# Patient Record
Sex: Female | Born: 1990 | Hispanic: Yes | Marital: Married | Smoking: Never smoker
Health system: Southern US, Community
[De-identification: ages and names within clinical notes are randomized; demographics above are authoritative.]

## PROBLEM LIST (undated history)

## (undated) DIAGNOSIS — R51 Headache: Secondary | ICD-10-CM

## (undated) DIAGNOSIS — R519 Headache, unspecified: Secondary | ICD-10-CM

---

## 2014-08-15 ENCOUNTER — Other Ambulatory Visit: Payer: Self-pay | Admitting: Advanced Practice Midwife

## 2014-08-15 DIAGNOSIS — Z3491 Encounter for supervision of normal pregnancy, unspecified, first trimester: Secondary | ICD-10-CM

## 2014-08-17 ENCOUNTER — Ambulatory Visit
Admission: RE | Admit: 2014-08-17 | Discharge: 2014-08-17 | Disposition: A | Discharge status: Home / Self Care | Payer: Medicaid Other | Source: Ambulatory Visit | Attending: Advanced Practice Midwife | Admitting: Advanced Practice Midwife

## 2014-08-17 DIAGNOSIS — Z3481 Encounter for supervision of other normal pregnancy, first trimester: Secondary | ICD-10-CM | POA: Insufficient documentation

## 2014-08-17 DIAGNOSIS — Z3491 Encounter for supervision of normal pregnancy, unspecified, first trimester: Secondary | ICD-10-CM

## 2014-11-16 ENCOUNTER — Other Ambulatory Visit: Payer: Self-pay | Admitting: Advanced Practice Midwife

## 2014-11-16 DIAGNOSIS — Z3689 Encounter for other specified antenatal screening: Secondary | ICD-10-CM

## 2014-11-23 ENCOUNTER — Ambulatory Visit
Admission: RE | Admit: 2014-11-23 | Discharge: 2014-11-23 | Disposition: A | Discharge status: Home / Self Care | Payer: Self-pay | Source: Ambulatory Visit | Attending: Advanced Practice Midwife | Admitting: Advanced Practice Midwife

## 2014-11-23 DIAGNOSIS — Z3689 Encounter for other specified antenatal screening: Secondary | ICD-10-CM

## 2014-11-23 DIAGNOSIS — Z36 Encounter for antenatal screening of mother: Secondary | ICD-10-CM | POA: Insufficient documentation

## 2015-02-09 ENCOUNTER — Encounter
Admission: RE | Admit: 2015-02-09 | Discharge: 2015-02-09 | Disposition: A | Discharge status: Home / Self Care | Payer: Medicaid Other | Source: Ambulatory Visit | Attending: Obstetrics and Gynecology | Admitting: Obstetrics and Gynecology

## 2015-02-09 DIAGNOSIS — Z01810 Encounter for preprocedural cardiovascular examination: Secondary | ICD-10-CM | POA: Insufficient documentation

## 2015-02-09 HISTORY — DX: Headache, unspecified: R51.9

## 2015-02-09 HISTORY — DX: Headache: R51

## 2015-02-09 LAB — CBC
HEMATOCRIT: 37.4 % (ref 35.0–47.0)
HEMOGLOBIN: 12.7 g/dL (ref 12.0–16.0)
MCH: 30.8 pg (ref 26.0–34.0)
MCHC: 34.1 g/dL (ref 32.0–36.0)
MCV: 90.4 fL (ref 80.0–100.0)
Platelets: 226 10*3/uL (ref 150–440)
RBC: 4.13 MIL/uL (ref 3.80–5.20)
RDW: 13.9 % (ref 11.5–14.5)
WBC: 10.3 10*3/uL (ref 3.6–11.0)

## 2015-02-09 LAB — RAPID HIV SCREEN (HIV 1/2 AB+AG)
HIV 1/2 ANTIBODIES: NONREACTIVE
HIV-1 P24 Antigen - HIV24: NONREACTIVE

## 2015-02-09 LAB — ABO/RH: ABO/RH(D): O POS

## 2015-02-09 LAB — TYPE AND SCREEN
ABO/RH(D): O POS
ANTIBODY SCREEN: NEGATIVE
EXTEND SAMPLE REASON: UNDETERMINED

## 2015-02-09 NOTE — H&P (Signed)
Patricia Harmon is a 25 y.o. G3 P2 EDC 02/16/15 based on 13 week u/s  female presenting for elective repeat c/s . 2 prior c/s in [place] . History   PHMX : obesity  OB History    Gravida Para Term Preterm AB TAB SAB Ectopic Multiple Living   1              Past Medical History  Diagnosis Date  . Headache    Past Surgical History  Procedure Laterality Date  . Cesarean section      x 2   Family History: family history is not on file. Social History:  reports that she has never smoked. She has never used smokeless tobacco. She reports that she does not drink alcohol or use illicit drugs.   Prenatal Transfer Tool  Maternal Diabetes: No Genetic Screening: Normal Maternal Ultrasounds/Referrals: Normal Fetal Ultrasounds or other Referrals:  None Maternal Substance Abuse:  No Significant Maternal Medications:  None Significant Maternal Lab Results:  None Other Comments:  None  ROS    Last menstrual period 05/30/2014. Exam Physical Exam   Lungs CTA  CV RRR   ABD  Gravid   Prenatal labs: ABO, Rh:  o+ Antibody:  neg Rubella:  immune  RPR:   NR HBsAg:   neg HIV:    GBS:   unknown   Assessment/Plan: Elective repeat LTCS . All questions answered . Declines BTL . Translator present to discuss risks .    Jamie-Lee Galdamez 02/09/2015, 8:38 AM

## 2015-02-10 ENCOUNTER — Inpatient Hospital Stay
Admission: RE | Admit: 2015-02-10 | Discharge: 2015-02-12 | DRG: 766 | Disposition: A | Discharge status: Home / Self Care | Payer: Medicaid Other | Source: Ambulatory Visit | Attending: Obstetrics and Gynecology | Admitting: Obstetrics and Gynecology

## 2015-02-10 ENCOUNTER — Inpatient Hospital Stay: Payer: Medicaid Other | Admitting: Certified Registered"

## 2015-02-10 ENCOUNTER — Encounter
Admission: RE | Disposition: A | Discharge status: Home / Self Care | Payer: Self-pay | Source: Ambulatory Visit | Attending: Obstetrics and Gynecology

## 2015-02-10 DIAGNOSIS — O34211 Maternal care for low transverse scar from previous cesarean delivery: Principal | ICD-10-CM | POA: Diagnosis present

## 2015-02-10 DIAGNOSIS — Z3A39 39 weeks gestation of pregnancy: Secondary | ICD-10-CM

## 2015-02-10 DIAGNOSIS — N838 Other noninflammatory disorders of ovary, fallopian tube and broad ligament: Secondary | ICD-10-CM | POA: Diagnosis present

## 2015-02-10 DIAGNOSIS — Z3490 Encounter for supervision of normal pregnancy, unspecified, unspecified trimester: Secondary | ICD-10-CM

## 2015-02-10 DIAGNOSIS — Z9889 Other specified postprocedural states: Secondary | ICD-10-CM

## 2015-02-10 LAB — RPR: RPR Ser Ql: NONREACTIVE

## 2015-02-10 SURGERY — Surgical Case
Anesthesia: Spinal | Wound class: Clean Contaminated

## 2015-02-10 MED ORDER — IBUPROFEN 600 MG PO TABS
600.0000 mg | ORAL_TABLET | Freq: Four times a day (QID) | ORAL | Status: DC
  Administered 2015-02-10: 600 mg via ORAL
  Filled 2015-02-10: qty 1

## 2015-02-10 MED ORDER — NALOXONE HCL 0.4 MG/ML IJ SOLN: 0.4000 mg | INTRAMUSCULAR | Status: DC | PRN

## 2015-02-10 MED ORDER — MENTHOL 3 MG MT LOZG: 1.0000 | LOZENGE | OROMUCOSAL | Status: DC | PRN

## 2015-02-10 MED ORDER — POTASSIUM CITRATE-CITRIC ACID 1100-334 MG/5ML PO SOLN: 10.0000 meq | Freq: Three times a day (TID) | ORAL | Status: DC

## 2015-02-10 MED ORDER — ACETAMINOPHEN 325 MG PO TABS
650.0000 mg | ORAL_TABLET | ORAL | Status: DC | PRN
Start: 2015-02-10 — End: 2015-02-12

## 2015-02-10 MED ORDER — LACTATED RINGERS IV SOLN
INTRAVENOUS | Status: DC
  Administered 2015-02-10: 06:00:00 via INTRAVENOUS

## 2015-02-10 MED ORDER — ONDANSETRON HCL 4 MG/2ML IJ SOLN
4.0000 mg | Freq: Three times a day (TID) | INTRAMUSCULAR | Status: DC | PRN
Start: 2015-02-10 — End: 2015-02-12

## 2015-02-10 MED ORDER — TETANUS-DIPHTH-ACELL PERTUSSIS 5-2.5-18.5 LF-MCG/0.5 IM SUSP: 0.5000 mL | Freq: Once | INTRAMUSCULAR | Status: DC

## 2015-02-10 MED ORDER — SIMETHICONE 80 MG PO CHEW
80.0000 mg | CHEWABLE_TABLET | Freq: Three times a day (TID) | ORAL | Status: DC
  Administered 2015-02-10 – 2015-02-12 (×6): 80 mg via ORAL
  Filled 2015-02-10 (×5): qty 1

## 2015-02-10 MED ORDER — LACTATED RINGERS IV SOLN
Freq: Once | INTRAVENOUS | Status: AC
  Administered 2015-02-10: 1000 mL via INTRAVENOUS

## 2015-02-10 MED ORDER — SIMETHICONE 80 MG PO CHEW
80.0000 mg | CHEWABLE_TABLET | ORAL | Status: DC
  Administered 2015-02-11: 80 mg via ORAL
  Filled 2015-02-10: qty 1

## 2015-02-10 MED ORDER — IBUPROFEN 600 MG PO TABS
600.0000 mg | ORAL_TABLET | Freq: Four times a day (QID) | ORAL | Status: DC | PRN
  Filled 2015-02-10: qty 1

## 2015-02-10 MED ORDER — DIBUCAINE 1 % RE OINT: 1.0000 "application " | TOPICAL_OINTMENT | RECTAL | Status: DC | PRN

## 2015-02-10 MED ORDER — DIPHENHYDRAMINE HCL 25 MG PO CAPS: 25.0000 mg | ORAL_CAPSULE | Freq: Four times a day (QID) | ORAL | Status: DC | PRN

## 2015-02-10 MED ORDER — NALOXONE HCL 2 MG/2ML IJ SOSY
1.0000 ug/kg/h | PREFILLED_SYRINGE | INTRAVENOUS | Status: DC | PRN
  Filled 2015-02-10: qty 2

## 2015-02-10 MED ORDER — OXYTOCIN 40 UNITS IN LACTATED RINGERS INFUSION - SIMPLE MED
INTRAVENOUS | Status: DC | PRN
  Administered 2015-02-10: 600 mL via INTRAVENOUS

## 2015-02-10 MED ORDER — SIMETHICONE 80 MG PO CHEW
80.0000 mg | CHEWABLE_TABLET | ORAL | Status: DC | PRN
  Filled 2015-02-10: qty 1

## 2015-02-10 MED ORDER — ONDANSETRON 4 MG PO TBDP
4.0000 mg | ORAL_TABLET | Freq: Four times a day (QID) | ORAL | Status: DC | PRN
  Filled 2015-02-10: qty 1

## 2015-02-10 MED ORDER — OXYCODONE HCL 5 MG PO TABS: 10.0000 mg | ORAL_TABLET | ORAL | Status: DC | PRN

## 2015-02-10 MED ORDER — PRENATAL MULTIVITAMIN CH
1.0000 | ORAL_TABLET | Freq: Every day | ORAL | Status: DC
  Administered 2015-02-10 – 2015-02-12 (×3): 1 via ORAL
  Filled 2015-02-10 (×4): qty 1

## 2015-02-10 MED ORDER — SENNOSIDES-DOCUSATE SODIUM 8.6-50 MG PO TABS
2.0000 | ORAL_TABLET | ORAL | Status: DC
  Administered 2015-02-11: 2 via ORAL
  Filled 2015-02-10: qty 2

## 2015-02-10 MED ORDER — NALBUPHINE HCL 10 MG/ML IJ SOLN: 5.0000 mg | Freq: Once | INTRAMUSCULAR | Status: DC | PRN

## 2015-02-10 MED ORDER — SCOPOLAMINE 1 MG/3DAYS TD PT72: 1.0000 | MEDICATED_PATCH | Freq: Once | TRANSDERMAL | Status: DC

## 2015-02-10 MED ORDER — DIPHENHYDRAMINE HCL 25 MG PO CAPS
25.0000 mg | ORAL_CAPSULE | ORAL | Status: DC | PRN
Start: 2015-02-10 — End: 2015-02-12

## 2015-02-10 MED ORDER — EPHEDRINE SULFATE 50 MG/ML IJ SOLN
INTRAMUSCULAR | Status: DC | PRN
  Administered 2015-02-10: 10 mg via INTRAVENOUS

## 2015-02-10 MED ORDER — ONDANSETRON HCL 4 MG/2ML IJ SOLN: 4.0000 mg | Freq: Once | INTRAMUSCULAR | Status: DC | PRN

## 2015-02-10 MED ORDER — IBUPROFEN 600 MG PO TABS
600.0000 mg | ORAL_TABLET | Freq: Four times a day (QID) | ORAL | Status: DC
  Administered 2015-02-11 – 2015-02-12 (×7): 600 mg via ORAL
  Filled 2015-02-10 (×7): qty 1

## 2015-02-10 MED ORDER — MORPHINE SULFATE (PF) 0.5 MG/ML IJ SOLN
INTRAMUSCULAR | Status: DC | PRN
  Administered 2015-02-10: .2 mg via EPIDURAL

## 2015-02-10 MED ORDER — NALBUPHINE HCL 10 MG/ML IJ SOLN: 5.0000 mg | INTRAMUSCULAR | Status: DC | PRN

## 2015-02-10 MED ORDER — WITCH HAZEL-GLYCERIN EX PADS: 1.0000 "application " | MEDICATED_PAD | CUTANEOUS | Status: DC | PRN

## 2015-02-10 MED ORDER — FENTANYL CITRATE (PF) 100 MCG/2ML IJ SOLN
25.0000 ug | INTRAMUSCULAR | Status: DC | PRN
  Administered 2015-02-10: 25 ug via INTRAVENOUS
  Filled 2015-02-10: qty 2

## 2015-02-10 MED ORDER — OXYTOCIN 10 UNIT/ML IJ SOLN: 2.5000 [IU]/h | INTRAVENOUS | Status: AC

## 2015-02-10 MED ORDER — DIPHENHYDRAMINE HCL 50 MG/ML IJ SOLN: 12.5000 mg | INTRAMUSCULAR | Status: DC | PRN

## 2015-02-10 MED ORDER — LANOLIN HYDROUS EX OINT: 1.0000 "application " | TOPICAL_OINTMENT | CUTANEOUS | Status: DC | PRN

## 2015-02-10 MED ORDER — MEPERIDINE HCL 25 MG/ML IJ SOLN: 6.2500 mg | INTRAMUSCULAR | Status: DC | PRN

## 2015-02-10 MED ORDER — ONDANSETRON HCL 4 MG/2ML IJ SOLN
INTRAMUSCULAR | Status: DC | PRN
  Administered 2015-02-10: 4 mg via INTRAVENOUS

## 2015-02-10 MED ORDER — BUPIVACAINE IN DEXTROSE 0.75-8.25 % IT SOLN
INTRATHECAL | Status: DC | PRN
  Administered 2015-02-10: 1.8 mL via INTRATHECAL

## 2015-02-10 MED ORDER — CITRIC ACID-SODIUM CITRATE 334-500 MG/5ML PO SOLN: 30.0000 mL | Freq: Three times a day (TID) | ORAL | Status: DC

## 2015-02-10 MED ORDER — LACTATED RINGERS IV SOLN
INTRAVENOUS | Status: DC
  Administered 2015-02-10 – 2015-02-11 (×3): via INTRAVENOUS

## 2015-02-10 MED ORDER — CEFAZOLIN SODIUM-DEXTROSE 2-3 GM-% IV SOLR
2.0000 g | INTRAVENOUS | Status: DC
  Filled 2015-02-10: qty 50

## 2015-02-10 MED ORDER — SODIUM CHLORIDE 0.9% FLUSH: 3.0000 mL | INTRAVENOUS | Status: DC | PRN

## 2015-02-10 MED ORDER — ZOLPIDEM TARTRATE 5 MG PO TABS: 5.0000 mg | ORAL_TABLET | Freq: Every evening | ORAL | Status: DC | PRN

## 2015-02-10 MED ORDER — OXYCODONE HCL 5 MG PO TABS
5.0000 mg | ORAL_TABLET | ORAL | Status: DC | PRN
  Administered 2015-02-11 – 2015-02-12 (×3): 5 mg via ORAL
  Filled 2015-02-10 (×3): qty 1

## 2015-02-10 SURGICAL SUPPLY — 23 items
BARRIER ADHS 3X4 INTERCEED (GAUZE/BANDAGES/DRESSINGS) ×3 IMPLANT
CANISTER SUCT 3000ML (MISCELLANEOUS) ×3 IMPLANT
CATH KIT ON-Q SILVERSOAK 5IN (CATHETERS) IMPLANT
CHLORAPREP W/TINT 26ML (MISCELLANEOUS) ×3 IMPLANT
DRSG TELFA 3X8 NADH (GAUZE/BANDAGES/DRESSINGS) ×3 IMPLANT
ELECT CAUTERY BLADE 6.4 (BLADE) ×3 IMPLANT
ELECT REM PT RETURN 9FT ADLT (ELECTROSURGICAL) ×3
ELECTRODE REM PT RTRN 9FT ADLT (ELECTROSURGICAL) ×1 IMPLANT
GAUZE SPONGE 4X4 12PLY STRL (GAUZE/BANDAGES/DRESSINGS) ×3 IMPLANT
GLOVE BIO SURGEON STRL SZ8 (GLOVE) ×9 IMPLANT
GOWN STRL REUS W/ TWL LRG LVL3 (GOWN DISPOSABLE) ×2 IMPLANT
GOWN STRL REUS W/ TWL XL LVL3 (GOWN DISPOSABLE) ×1 IMPLANT
GOWN STRL REUS W/TWL LRG LVL3 (GOWN DISPOSABLE) ×4
GOWN STRL REUS W/TWL XL LVL3 (GOWN DISPOSABLE) ×2
NS IRRIG 1000ML POUR BTL (IV SOLUTION) ×3 IMPLANT
PACK C SECTION AR (MISCELLANEOUS) ×3 IMPLANT
PAD OB MATERNITY 4.3X12.25 (PERSONAL CARE ITEMS) ×3 IMPLANT
PAD PREP 24X41 OB/GYN DISP (PERSONAL CARE ITEMS) ×3 IMPLANT
STAPLER INSORB 30 2030 C-SECTI (MISCELLANEOUS) ×3 IMPLANT
STRAP SAFETY BODY (MISCELLANEOUS) ×3 IMPLANT
SUT CHROMIC 1 CTX 36 (SUTURE) ×9 IMPLANT
SUT PLAIN GUT 0 (SUTURE) ×6 IMPLANT
SUT VIC AB 0 CT1 36 (SUTURE) ×6 IMPLANT

## 2015-02-10 NOTE — Anesthesia Procedure Notes (Signed)
Spinal Patient location during procedure: OR Start time: 02/10/2015 7:50 AM End time: 02/10/2015 7:55 AM Staffing Performed by: anesthesiologist  Preanesthetic Checklist Completed: patient identified, site marked, surgical consent, pre-op evaluation, timeout performed, IV checked, risks and benefits discussed and monitors and equipment checked Spinal Block Patient position: sitting Prep: Betadine Patient monitoring: heart rate and blood pressure Approach: midline Location: L3-4 Injection technique: single-shot Needle Needle type: Quincke  Needle gauge: 25 G Needle length: 9 cm Needle insertion depth: 5 cm Assessment Sensory level: T6

## 2015-02-10 NOTE — Progress Notes (Signed)
Dr. Mordecai Rasmussen at bedside

## 2015-02-10 NOTE — Lactation Note (Signed)
This note was copied from the chart of Patricia Harmon. Lactation Consultation Note  Patient Name: Patricia Harmon [id number] Date: 02/10/2015 Reason for consult: Initial assessment   Maternal Data Does the patient have breastfeeding experience prior to this delivery?: Yes Rounds with Maryjane Hurter, spanish interpreter, pt breastfed 2 previous children x 1 yr each, has just breastfed so unable to assess latch, mother states that baby latches fair, would do better if she had more milk, encouraged pt that breastfeeding frequently makes more milk for baby.  Feeding Feeding Type: Breast Fed Length of feed: 10 min  LATCH Score/Interventions                      Lactation Tools Discussed/Used     Consult Status Consult Status: PRN    Dyann Kief 02/10/2015, 4:06 PM

## 2015-02-10 NOTE — Progress Notes (Signed)
Pt interviewed with translator . NPO . Ready for repeat LTCS . All questions answered

## 2015-02-10 NOTE — Brief Op Note (Signed)
02/10/2015  8:44 AM  PATIENT:  Patricia Harmon  25 y.o. female  PRE-OPERATIVE DIAGNOSIS:  repeat LTCS  POST-OPERATIVE DIAGNOSIS:  repeat LTCS, complex paratubal cyst  PROCEDURE:  Procedure(s): CESAREAN SECTION with left ovarian cystectomy (N/A)  SURGEON:  Surgeon(s) and Role:    Suzy Bouchard, MD - Primary  PHYSICIAN ASSISTANT:   ASSISTANTS: [employer] PA student Effie Shy   ANESTHESIA:   spinal  EBL:  500 cc, IIOF 1100 cc, Uo 100 cc  BLOOD ADMINISTERED:none  DRAINS: Urinary Catheter (Foley)   LOCAL MEDICATIONS USED:  NONE  SPECIMEN:  Source of Specimen:  left paratubal cyst  DISPOSITION OF SPECIMEN:  PATHOLOGY  COUNTS:  YES  TOURNIQUET:  * No tourniquets in log *  DICTATION: .Other Dictation: Dictation Number verbal   PLAN OF CARE: Admit to inpatient   PATIENT DISPOSITION:  PACU - hemodynamically stable.   Delay start of Pharmacological VTE agent (>24hrs) due to surgical blood loss or risk of bleeding: not applicable

## 2015-02-10 NOTE — Transfer of Care (Signed)
Immediate Anesthesia Transfer of Care Note  Patient: Patricia Harmon  Procedure(s) Performed: Procedure(s): CESAREAN SECTION with left ovarian cystectomy (N/A)  Patient Location: Mother/Baby  Anesthesia Type:Spinal  Level of Consciousness: awake, alert , oriented and patient cooperative  Airway & Oxygen Therapy: Patient Spontanous Breathing  Post-op Assessment: Report given to RN, Post -op Vital signs reviewed and stable and Patient moving all extremities X 4  Post vital signs: Reviewed and stable  Last Vitals:  Filed Vitals:   02/10/15 0526 02/10/15 0851  BP: 118/74 116/53  Pulse: 98 75  Temp: 36.4 C 36.5 C  Resp:  15    Complications: No apparent anesthesia complications

## 2015-02-10 NOTE — Anesthesia Preprocedure Evaluation (Signed)
Anesthesia Evaluation  Patient identified by MRN, date of birth, ID band Patient awake    Reviewed: Allergy & Precautions, NPO status , Patient's Chart, lab work & pertinent test results  Airway Mallampati: II       Dental no notable dental hx.    Pulmonary neg pulmonary ROS,    Pulmonary exam normal        Cardiovascular Exercise Tolerance: Good negative cardio ROS   Rhythm:Regular     Neuro/Psych    GI/Hepatic negative GI ROS, Neg liver ROS,   Endo/Other  negative endocrine ROS  Renal/GU negative Renal ROS     Musculoskeletal negative musculoskeletal ROS (+)   Abdominal Normal abdominal exam  (+)   Peds  Hematology negative hematology ROS (+)   Anesthesia Other Findings   Reproductive/Obstetrics                             Anesthesia Physical Anesthesia Plan  ASA: II  Anesthesia Plan: Spinal   Post-op Pain Management:    Induction:   Airway Management Planned: Natural Airway  Additional Equipment:   Intra-op Plan:   Post-operative Plan:   Informed Consent: I have reviewed the patients History and Physical, chart, labs and discussed the procedure including the risks, benefits and alternatives for the proposed anesthesia with the patient or authorized representative who has indicated his/her understanding and acceptance.     Plan Discussed with: CRNA  Anesthesia Plan Comments:         Anesthesia Quick Evaluation

## 2015-02-11 LAB — CBC
HEMATOCRIT: 31.3 % — AB (ref 35.0–47.0)
HEMOGLOBIN: 10.5 g/dL — AB (ref 12.0–16.0)
MCH: 30.9 pg (ref 26.0–34.0)
MCHC: 33.6 g/dL (ref 32.0–36.0)
MCV: 92 fL (ref 80.0–100.0)
Platelets: 183 10*3/uL (ref 150–440)
RBC: 3.41 MIL/uL — ABNORMAL LOW (ref 3.80–5.20)
RDW: 14.1 % (ref 11.5–14.5)
WBC: 11.8 10*3/uL — ABNORMAL HIGH (ref 3.6–11.0)

## 2015-02-11 NOTE — Op Note (Signed)
NAME:  Patricia Harmon, Patricia Harmon NO.:  [id number]  MEDICAL RECORD NO.:  [id number]  LOCATION:  [facility]                        FACILITY:  [facility]  PHYSICIAN:  Jennell Corner, MDDATE OF BIRTH:  11-05-1990  DATE OF PROCEDURE: DATE OF DISCHARGE:                              OPERATIVE REPORT   PREOPERATIVE DIAGNOSIS: 1. Elective repeat cesarean section. 2. 39+1 weeks estimated gestational age.  POSTOPERATIVE DIAGNOSIS: 1. Elective repeat cesarean section. 2. 39+[redacted] weeks gestational age. 3. Complex left paraovarian cyst.  PROCEDURE PERFORMED: 1. Elective repeat cesarean section. 2. Left ovarian cystectomy.  SURGEON:  Jennell Corner, MD  ANESTHESIA:  Spinal.  FIRST ASSISTANT:  Scrub tech.  SECOND ASSISTANT:  Elizabeth Palau, PA student, Effie Shy.  INDICATION:  This is a 25 year old gravida 3, para 2 patient with 2 prior cesarean sections.  The patient's [facility] of February 16, 2015.  The patient elects for repeat cesarean section.  DESCRIPTION OF PROCEDURE:  After adequate spinal anesthesia, the patient was placed in dorsal supine position, hip roll on the right side.  The patient had previously received 2 g IV Ancef prior to commencement of the case.  The patient was prepped and draped in normal sterile fashion. Time-out was performed.  A Pfannenstiel incision was made 2 fingerbreadths above the symphysis pubis.  Sharp dissection was used to identify the fascia.  Fascia was opened in the midline and opened in a transverse fashion.  The superior aspect of the fascia was grasped with Kocher clamps and the recti muscles dissected free.  The inferior aspect of the fascia was grasped with Kocher clamps and pyramidalis muscle was dissected free.  Entry into the peritoneal cavity was accomplished sharply.  There were some adhesions to the lower uterine segment, which were removed sharply.  A low transverse uterine incision was made upon entry into the endometrial cavity, clear  fluid resulted.  The incision was extended with blunt transverse traction.  Fetal head was brought to the incision.  A vacuum was applied and with 1 gentle pull, the head was delivered aided by fundal pressure.  Vacuum was removed.  Shoulders and body were delivered without difficulty.  Vigorous female.  Oral and nasopharynx were suctioned.  Cord was clamped, and infant was passed to the pediatrician, who assigned Apgar scores of 9 and 9.  Weight 7 pounds 8 ounces.  The placenta was manually delivered, and the uterus was externalized.  The endometrial cavity was wiped clean with laparotomy tape, and ring forceps was used to open the cervix, and this was passed off the operative field.  Uterine incision was closed with 1 chromic suture in a running, locking fashion.  Two additional figure-of-eight sutures were used for hemostasis.  Of note, the left ovary, the distal portion, considered a paratubal area, 1.5 x 1.5 cm, was firm, possibly consistent with a dermoid.  This area was clamped and the cyst was removed intact and the pedicle was closed with 2-0 Vicryl suture.  Good hemostasis was noted.  This specimen will be sent to Pathology for permanent section and identification.  The uterus was placed back into the abdominal cavity without difficulty.  Paracolic gutters were wiped clean with laparotomy tape and the uterine incision again appeared hemostatic.  The  fascia was closed with 0 Vicryl suture in a running, nonlocking fashion, and subcutaneous tissues were irrigated and bovied for hemostasis.  The skin was reapproximated with Insorb absorbable staples.  Good cosmetic effect.  Good hemostasis was noted.  The patient tolerated the procedure well.  There were no complications.  ESTIMATED BLOOD LOSS:  500 mL.  INTRAOPERATIVE FLUIDS:  1100 mL.  URINE OUTPUT:  100 mL.  The patient was taken to recovery room in good condition.          ______________________________ Jennell Corner, MD     TS/MEDQ  D:  02/10/2015  T:  02/10/2015  Job:  [id number]

## 2015-02-11 NOTE — Progress Notes (Signed)
Post Partum/Operative Day 1 Spanish interpreter used  Subjective: no complaints, voiding, tolerating PO and + flatus No CP, no SOB, no HA, no blurry vision. Not yet OOB  Objective: Blood pressure 98/50, pulse 84, temperature 98.2 F (36.8 C), temperature source Oral, resp. rate 20, weight 90.719 kg (200 lb), last menstrual period 05/30/2014, SpO2 99 %, unknown if currently breastfeeding.  Physical Exam:  General: alert, cooperative and no distress Lochia: appropriate Uterine Fundus: firm Incision: covered with bandage DVT Evaluation: No evidence of DVT seen on physical exam. ABD: soft, distended, nontender   Recent Labs  02/09/15 0857 02/11/15 0629  HGB 12.7 10.5*  HCT 37.4 31.3*    Assessment/Plan: - OOB - PO intake - pain control with ibuprofen and percocet - monitor vitals and UOP  Anticipate d/c POD#3   LOS: 1 day   Ala Dach 02/11/2015, 9:38 AM

## 2015-02-12 MED ORDER — OXYCODONE HCL 5 MG PO TABS: 5.0000 mg | ORAL_TABLET | ORAL | Status: DC | PRN

## 2015-02-12 MED ORDER — DOCUSATE SODIUM 100 MG PO CAPS: 100.0000 mg | ORAL_CAPSULE | Freq: Two times a day (BID) | ORAL | Status: DC

## 2015-02-12 MED ORDER — IBUPROFEN 600 MG PO TABS: 600.0000 mg | ORAL_TABLET | Freq: Four times a day (QID) | ORAL | Status: DC

## 2015-02-12 NOTE — Anesthesia Postprocedure Evaluation (Signed)
Anesthesia Post Note  Patient: Fayelynn Distel  Procedure(s) Performed: Procedure(s) (LRB): CESAREAN SECTION with left ovarian cystectomy (N/A)  Patient location during evaluation: Mother Baby Anesthesia Type: Spinal Level of consciousness: awake and alert Pain management: pain level controlled Vital Signs Assessment: post-procedure vital signs reviewed and stable Respiratory status: spontaneous breathing, nonlabored ventilation, respiratory function stable and patient connected to nasal cannula oxygen Cardiovascular status: blood pressure returned to baseline and stable Postop Assessment: no signs of nausea or vomiting Anesthetic complications: no    Last Vitals:  Filed Vitals:   02/12/15 0319 02/12/15 0745  BP: 108/54 96/48  Pulse: 84 72  Temp: 36.6 C 36.4 C  Resp: 18 18    Last Pain:  Filed Vitals:   02/12/15 0943  PainSc: 6                  Lenard Simmer

## 2015-02-12 NOTE — Progress Notes (Signed)
Discharge instructions reviewed with patient and family with interpreter.  Questions answered.  Rxs given.  Telfa pads and tape given per Dr. Francesca Oman instructions.

## 2015-02-12 NOTE — Progress Notes (Signed)
Mom and baby taken out via wheelchair with nursing student.  Ibuprofen given by nursing student just before discharge.  Brother and another support person present at discharge.

## 2015-02-12 NOTE — Discharge Summary (Signed)
Obstetric Discharge Summary Reason for Admission: cesarean section Prenatal Procedures: none Intrapartum Procedures: cesarean: low cervical, transverse Postpartum Procedures: none Complications-Operative and Postpartum: none HEMOGLOBIN  Date Value Ref Range Status  02/11/2015 10.5* 12.0 - 16.0 g/dL Final   HCT  Date Value Ref Range Status  02/11/2015 31.3* 35.0 - 47.0 % Final    Physical Exam:  General: alert and cooperative Lochia: appropriate Uterine Fundus: firm Incision: healing well DVT Evaluation: No evidence of DVT seen on physical exam. Lungs cta  Cv RRR  adb Soft + BS, NT  Discharge Diagnoses: Term Pregnancy-delivered  Discharge Information: Date: 02/12/2015 Activity: pelvic rest Diet: routine Medications: roxicodone, motrin , colace  Condition: stable Instructions: refer to practice specific booklet Discharge to: home Follow-up Information    Follow up with SCHERMERHORN,THOMAS, MD In 2 weeks.   Specialty:  Obstetrics and Gynecology   Why:  For wound re-check   Contact information:   [address] [place] [address] [phone]       Newborn Data: Live born female  Birth Weight: 7 lb 7.9 oz (3400 g) APGAR: 9, 9  Home with mother.  SCHERMERHORN,THOMAS 02/12/2015, 1:37 PM

## 2015-02-12 NOTE — Anesthesia Post-op Follow-up Note (Signed)
  Anesthesia Pain Follow-up Note  Patient: Patricia Harmon  Day #: 2  Date of Follow-up: 02/12/2015 Time: 10:18 AM  Last Vitals:  Filed Vitals:   02/12/15 0319 02/12/15 0745  BP: 108/54 96/48  Pulse: 84 72  Temp: 36.6 C 36.4 C  Resp: 18 18    Level of Consciousness: alert  Pain: mild   Side Effects:None  Catheter Site Exam:clean, dry, no drainage  Plan: D/C from anesthesia care  Lenard Simmer

## 2015-02-12 NOTE — Discharge Instructions (Signed)

## 2015-02-14 LAB — SURGICAL PATHOLOGY

## 2016-01-13 ENCOUNTER — Encounter: Payer: Self-pay | Admitting: Emergency Medicine

## 2016-01-13 ENCOUNTER — Emergency Department: Payer: Medicaid Other

## 2016-01-13 DIAGNOSIS — N898 Other specified noninflammatory disorders of vagina: Secondary | ICD-10-CM | POA: Insufficient documentation

## 2016-01-13 DIAGNOSIS — O034 Incomplete spontaneous abortion without complication: Secondary | ICD-10-CM | POA: Insufficient documentation

## 2016-01-13 DIAGNOSIS — Z3A01 Less than 8 weeks gestation of pregnancy: Secondary | ICD-10-CM | POA: Insufficient documentation

## 2016-01-13 LAB — URINALYSIS, COMPLETE (UACMP) WITH MICROSCOPIC
SPECIFIC GRAVITY, URINE: 1.022 (ref 1.005–1.030)
Squamous Epithelial / LPF: NONE SEEN

## 2016-01-13 LAB — COMPREHENSIVE METABOLIC PANEL
ALBUMIN: 4 g/dL (ref 3.5–5.0)
ALK PHOS: 99 U/L (ref 38–126)
ALT: 12 U/L — ABNORMAL LOW (ref 14–54)
ANION GAP: 7 (ref 5–15)
AST: 21 U/L (ref 15–41)
BUN: 10 mg/dL (ref 6–20)
CALCIUM: 9.4 mg/dL (ref 8.9–10.3)
CO2: 25 mmol/L (ref 22–32)
Chloride: 105 mmol/L (ref 101–111)
Creatinine, Ser: 0.56 mg/dL (ref 0.44–1.00)
GFR calc Af Amer: >60 mL/min (ref >60)
GFR calc non Af Amer: >60 mL/min (ref >60)
GLUCOSE: 98 mg/dL (ref 65–99)
Potassium: 4 mmol/L (ref 3.5–5.1)
Sodium: 137 mmol/L (ref 135–145)
Total Bilirubin: 0.4 mg/dL (ref 0.3–1.2)
Total Protein: 8.1 g/dL (ref 6.5–8.1)

## 2016-01-13 LAB — CBC
HEMATOCRIT: 41.3 % (ref 35.0–47.0)
HEMOGLOBIN: 14 g/dL (ref 12.0–16.0)
MCH: 30.3 pg (ref 26.0–34.0)
MCHC: 34 g/dL (ref 32.0–36.0)
MCV: 89 fL (ref 80.0–100.0)
Platelets: 317 10*3/uL (ref 150–440)
RBC: 4.63 MIL/uL (ref 3.80–5.20)
RDW: 12.1 % (ref 11.5–14.5)
WBC: 14.4 10*3/uL — ABNORMAL HIGH (ref 3.6–11.0)

## 2016-01-13 LAB — HCG, QUANTITATIVE, PREGNANCY: hCG, Beta Chain, Quant, S: 4843 m[IU]/mL — ABNORMAL HIGH (ref <5)

## 2016-01-13 LAB — LIPASE, BLOOD: Lipase: 20 U/L (ref 11–51)

## 2016-01-13 NOTE — ED Triage Notes (Signed)
Pt is [redacted] weeks pregnant with 4th child, reports vaginal bleeding that started on Wednesday, passing some clots now and changing pad every 2 hours. Pt states she also started having low abdominal pain today which she describes as stabbing and burning. Denies any urinary sxs. [facility]

## 2016-01-14 ENCOUNTER — Emergency Department
Admission: EM | Admit: 2016-01-14 | Discharge: 2016-01-14 | Disposition: A | Discharge status: Home / Self Care | Payer: Medicaid Other | Attending: Emergency Medicine | Admitting: Emergency Medicine

## 2016-01-14 DIAGNOSIS — O034 Incomplete spontaneous abortion without complication: Secondary | ICD-10-CM

## 2016-01-14 DIAGNOSIS — O4690 Antepartum hemorrhage, unspecified, unspecified trimester: Secondary | ICD-10-CM

## 2016-01-14 LAB — ABO/RH: ABO/RH(D): O POS

## 2016-01-14 NOTE — ED Provider Notes (Signed)
Time Seen: Approximately *106 I have reviewed the triage notes  Chief Complaint: Vaginal Bleeding and Abdominal Pain   History of Present Illness: Patricia Harmon is a 26 y.o. female who presents at 7 weeks pregnancy with vaginal bleeding that started on Wednesday. She's been passing clots and changes pads every 2 hours though not a full pad. Patient had some lower abdominal cramping today. The patient's currently gravida 4 para 3.   Past Medical History:  Diagnosis Date  . Headache     Patient Active Problem List   Diagnosis Date Noted  . Pregnant 02/10/2015  . Postoperative state 02/10/2015    Past Surgical History:  Procedure Laterality Date  . CESAREAN SECTION     x 2  . CESAREAN SECTION N/A 02/10/2015   Procedure: CESAREAN SECTION with left ovarian cystectomy;  Surgeon: Suzy Bouchardhomas J Schermerhorn, MD;  Location: [facility] ORS;  Service: Obstetrics;  Laterality: N/A;    Past Surgical History:  Procedure Laterality Date  . CESAREAN SECTION     x 2  . CESAREAN SECTION N/A 02/10/2015   Procedure: CESAREAN SECTION with left ovarian cystectomy;  Surgeon: Suzy Bouchardhomas J Schermerhorn, MD;  Location: [facility] ORS;  Service: Obstetrics;  Laterality: N/A;    Current Outpatient Rx  . Order #: [id number] Class: Normal  . Order #: [id number] Class: Normal  . Order #: [id number] Class: Print  . Order #: [id number] Class: Historical Med    Allergies:  Patient has no known allergies.  Family History: History reviewed. No pertinent family history.  Social History: Social History  Substance Use Topics  . Smoking status: Never Smoker  . Smokeless tobacco: Never Used  . Alcohol use No     Review of Systems:   10 point review of systems was performed and was otherwise negative:  Constitutional: No fever Eyes: No visual disturbances ENT: No sore throat, ear pain Cardiac: No chest pain Respiratory: No shortness of breath, wheezing, or stridor Abdomen: No Current abdominal pain, no vomiting,  No diarrhea Endocrine: No weight loss, No night sweats Extremities: No peripheral edema, cyanosis Skin: No rashes, easy bruising Neurologic: No focal weakness, trouble with speech or swollowing Urologic: No dysuria, Hematuria, or urinary frequency   Physical Exam:  ED Triage Vitals  Enc Vitals Group     BP 01/13/16 1851 132/78     Pulse Rate 01/13/16 1851 97     Resp 01/13/16 1851 18     Temp 01/13/16 1851 98.5 F (36.9 C)     Temp Source 01/13/16 1851 Oral     SpO2 01/13/16 1851 99 %     Weight 01/13/16 1851 200 lb (90.7 kg)     Height 01/13/16 1851 5\' 4"  (1.626 m)     Head Circumference --      Peak Flow --      Pain Score 01/13/16 1846 8     Pain Loc --      Pain Edu? --      Excl. in GC? --     General: Awake , Alert , and Oriented times 3; GCS 15 Head: Normal cephalic , atraumatic Eyes: Pupils equal , round, reactive to light Nose/Throat: No nasal drainage, patent upper airway without erythema or exudate.  Neck: Supple, Full range of motion, No anterior adenopathy or palpable thyroid masses Lungs: Clear to ascultation without wheezes , rhonchi, or rales Heart: Regular rate, regular rhythm without murmurs , gallops , or rubs Abdomen: Soft, non tender without rebound, guarding , or rigidity; bowel sounds positive and  symmetric in all 4 quadrants. No organomegaly .        Extremities: 2 plus symmetric pulses. No edema, clubbing or cyanosis Neurologic: normal ambulation, Motor symmetric without deficits, sensory intact Skin: warm, dry, no rashes Pelvic exam deferred for ultrasound evaluation  Labs:   All laboratory work was reviewed including any pertinent negatives or positives listed below:  Labs Reviewed  HCG, QUANTITATIVE, PREGNANCY - Abnormal; Notable for the following:       Result Value   hCG, Beta Chain, Quant, S 4,843 (*)    All other components within normal limits  COMPREHENSIVE METABOLIC PANEL - Abnormal; Notable for the following:    ALT 12 (*)    All  other components within normal limits  CBC - Abnormal; Notable for the following:    WBC 14.4 (*)    All other components within normal limits  URINALYSIS, COMPLETE (UACMP) WITH MICROSCOPIC - Abnormal; Notable for the following:    Color, Urine RED (*)    APPearance CLOUDY (*)    Glucose, UA   (*)    Value: TEST NOT REPORTED DUE TO COLOR INTERFERENCE OF URINE PIGMENT   Hgb urine dipstick   (*)    Value: TEST NOT REPORTED DUE TO COLOR INTERFERENCE OF URINE PIGMENT   Bilirubin Urine   (*)    Value: TEST NOT REPORTED DUE TO COLOR INTERFERENCE OF URINE PIGMENT   Ketones, ur   (*)    Value: TEST NOT REPORTED DUE TO COLOR INTERFERENCE OF URINE PIGMENT   Protein, ur   (*)    Value: TEST NOT REPORTED DUE TO COLOR INTERFERENCE OF URINE PIGMENT   Nitrite   (*)    Value: TEST NOT REPORTED DUE TO COLOR INTERFERENCE OF URINE PIGMENT   Leukocytes, UA   (*)    Value: TEST NOT REPORTED DUE TO COLOR INTERFERENCE OF URINE PIGMENT   Bacteria, UA RARE (*)    All other components within normal limits  LIPASE, BLOOD  ABO/RH    Radiology:  "US Ob Comp Less 14 Wks  Result Date: 01/13/2016 CLINICAL DATA:  Vaginal bleeding and lower abdominal pain EXAM: OBSTETRIC <14 WK [place] AND TRANSVAGINAL OB US TECHNIQUE: Both transabdominal and transvaginal ultrasound examinations were performed for complete evaluation of the gestation as well as the maternal uterus, adnexal regions, and pelvic cul-de-sac. Transvaginal technique was performed to assess early pregnancy. COMPARISON:  None. FINDINGS: Intrauterine gestational sac: Visualized Yolk sac:  Visualized Embryo:  Not visualized Cardiac Activity: Not visualized Maternal uterus/adnexae: The endometriomas stripe is thickened. There is low positioning of the gestational sac which is visualized within the lower uterine segment/ upper cervical area. Bilateral ovaries are within normal limits. The right ovary measures 3.1 x 1.5 by 2.1 cm. The left ovary measures 2 x 2.3 x 2.1 cm.  No free fluid. IMPRESSION: Low lying gestational sac which is visualized within the lower uterine segment/ upper cervix. Findings could relate to miscarriage in progress, however cervical ectopic pregnancy must also be considered. Cesarean scar pregnancy is felt unlikely given ample myometrium anterior to the gestational sac. Electronically Signed   By: Jasmine Pang M.D.   On: 01/13/2016 23:51   US Ob Transvaginal  Result Date: 01/13/2016 CLINICAL DATA:  Vaginal bleeding and lower abdominal pain EXAM: OBSTETRIC <14 WK [place] AND TRANSVAGINAL OB US TECHNIQUE: Both transabdominal and transvaginal ultrasound examinations were performed for complete evaluation of the gestation as well as the maternal uterus, adnexal regions, and pelvic cul-de-sac. Transvaginal technique was  performed to assess early pregnancy. COMPARISON:  None. FINDINGS: Intrauterine gestational sac: Visualized Yolk sac:  Visualized Embryo:  Not visualized Cardiac Activity: Not visualized Maternal uterus/adnexae: The endometriomas stripe is thickened. There is low positioning of the gestational sac which is visualized within the lower uterine segment/ upper cervical area. Bilateral ovaries are within normal limits. The right ovary measures 3.1 x 1.5 by 2.1 cm. The left ovary measures 2 x 2.3 x 2.1 cm. No free fluid. IMPRESSION: Low lying gestational sac which is visualized within the lower uterine segment/ upper cervix. Findings could relate to miscarriage in progress, however cervical ectopic pregnancy must also be considered. Cesarean scar pregnancy is felt unlikely given ample myometrium anterior to the gestational sac. Electronically Signed   By: Jasmine Pang M.D.   On: 01/13/2016 23:51  "  I personally reviewed the radiologic studies    ED Course: * Patient most likely having a miscarriage is incomplete at this point. We discussed at the bedside through interpreter that she most likely will go on to pass a large clot. She has vaginal  bleeding of 1 full pad every 2 hours for a consecutive hours and she should consider returning to the emergency department. She was referred to OB/GYN unassigned and advised to call on Monday to have a follow-up recheck of her serum quantitative pregnancy and/or possibly a D&C if things have not resolved by that time. She was advised she can take Tylenol over-the-counter for pain. All questions and concerns were addressed with the interpreter and the nurse at the bedside. Clinical Course      Assessment:  Incomplete spontaneous abortion    Plan: Outpatient Discharge instructions were in Spanish Patient was advised to return immediately if condition worsens. Patient was advised to follow up with their primary care physician or other specialized physicians involved in their outpatient care. The patient and/or family member/power of attorney had laboratory results reviewed at the bedside. All questions and concerns were addressed and appropriate discharge instructions were distributed by the nursing staff.            Jennye Moccasin, MD 01/14/16 [phone]

## 2016-01-14 NOTE — Discharge Instructions (Signed)
Please return immediately if condition worsens. Please contact her primary physician or the physician you were given for referral. If you have any specialist physicians involved in her treatment and plan please also contact them. Thank you for using [place] regional emergency Department. ° °

## 2016-06-12 ENCOUNTER — Other Ambulatory Visit: Payer: Self-pay | Admitting: Advanced Practice Midwife

## 2016-06-12 DIAGNOSIS — Z369 Encounter for antenatal screening, unspecified: Secondary | ICD-10-CM

## 2016-06-27 ENCOUNTER — Ambulatory Visit
Admission: RE | Admit: 2016-06-27 | Discharge: 2016-06-27 | Disposition: A | Discharge status: Home / Self Care | Payer: Self-pay | Source: Ambulatory Visit | Attending: Obstetrics & Gynecology | Admitting: Obstetrics & Gynecology

## 2016-06-27 ENCOUNTER — Encounter: Payer: Self-pay | Admitting: *Deleted

## 2016-06-27 ENCOUNTER — Ambulatory Visit
Admission: RE | Admit: 2016-06-27 | Discharge: 2016-06-27 | Disposition: A | Discharge status: Home / Self Care | Payer: Medicaid Other | Source: Ambulatory Visit | Attending: Obstetrics & Gynecology | Admitting: Obstetrics & Gynecology

## 2016-06-27 VITALS — BP 112/66 | HR 69 | Temp 98.7°F | Resp 18 | Wt 186.0 lb

## 2016-06-27 DIAGNOSIS — Z369 Encounter for antenatal screening, unspecified: Secondary | ICD-10-CM

## 2016-06-27 DIAGNOSIS — Z3689 Encounter for other specified antenatal screening: Secondary | ICD-10-CM

## 2016-08-19 ENCOUNTER — Other Ambulatory Visit: Payer: Self-pay | Admitting: *Deleted

## 2016-08-19 DIAGNOSIS — O99212 Obesity complicating pregnancy, second trimester: Secondary | ICD-10-CM

## 2016-08-22 ENCOUNTER — Ambulatory Visit
Admission: RE | Admit: 2016-08-22 | Discharge: 2016-08-22 | Disposition: A | Discharge status: Home / Self Care | Payer: Self-pay | Source: Ambulatory Visit | Attending: Obstetrics and Gynecology | Admitting: Obstetrics and Gynecology

## 2016-08-22 DIAGNOSIS — Z3A18 18 weeks gestation of pregnancy: Secondary | ICD-10-CM | POA: Insufficient documentation

## 2016-08-22 DIAGNOSIS — O99212 Obesity complicating pregnancy, second trimester: Secondary | ICD-10-CM | POA: Insufficient documentation

## 2016-10-28 ENCOUNTER — Other Ambulatory Visit: Payer: Self-pay | Admitting: *Deleted

## 2016-10-28 DIAGNOSIS — O4420 Partial placenta previa NOS or without hemorrhage, unspecified trimester: Secondary | ICD-10-CM

## 2016-10-31 ENCOUNTER — Ambulatory Visit: Payer: Self-pay

## 2017-04-19 ENCOUNTER — Encounter (HOSPITAL_COMMUNITY): Payer: Self-pay

## 2018-02-18 ENCOUNTER — Other Ambulatory Visit: Payer: Self-pay

## 2018-02-18 ENCOUNTER — Encounter: Payer: Self-pay | Admitting: Emergency Medicine

## 2018-02-18 ENCOUNTER — Emergency Department: Payer: Self-pay

## 2018-02-18 ENCOUNTER — Emergency Department
Admission: EM | Admit: 2018-02-18 | Discharge: 2018-02-18 | Disposition: A | Discharge status: Home / Self Care | Payer: Self-pay | Attending: Emergency Medicine | Admitting: Emergency Medicine

## 2018-02-18 DIAGNOSIS — N390 Urinary tract infection, site not specified: Secondary | ICD-10-CM | POA: Insufficient documentation

## 2018-02-18 DIAGNOSIS — R202 Paresthesia of skin: Secondary | ICD-10-CM | POA: Insufficient documentation

## 2018-02-18 DIAGNOSIS — Z733 Stress, not elsewhere classified: Secondary | ICD-10-CM | POA: Insufficient documentation

## 2018-02-18 DIAGNOSIS — R5383 Other fatigue: Secondary | ICD-10-CM | POA: Insufficient documentation

## 2018-02-18 DIAGNOSIS — R42 Dizziness and giddiness: Secondary | ICD-10-CM | POA: Insufficient documentation

## 2018-02-18 DIAGNOSIS — R531 Weakness: Secondary | ICD-10-CM | POA: Insufficient documentation

## 2018-02-18 LAB — URINALYSIS, COMPLETE (UACMP) WITH MICROSCOPIC
BILIRUBIN URINE: NEGATIVE
Glucose, UA: NEGATIVE mg/dL
KETONES UR: NEGATIVE mg/dL
Nitrite: NEGATIVE
PH: 7 (ref 5.0–8.0)
Protein, ur: 30 mg/dL — AB
Specific Gravity, Urine: 1.009 (ref 1.005–1.030)
WBC, UA: >50 WBC/hpf — ABNORMAL HIGH (ref 0–5)

## 2018-02-18 LAB — BASIC METABOLIC PANEL
ANION GAP: 5 (ref 5–15)
BUN: 11 mg/dL (ref 6–20)
CALCIUM: 9 mg/dL (ref 8.9–10.3)
CO2: 28 mmol/L (ref 22–32)
CREATININE: 0.59 mg/dL (ref 0.44–1.00)
Chloride: 103 mmol/L (ref 98–111)
GFR calc Af Amer: >60 mL/min (ref >60)
GFR calc non Af Amer: >60 mL/min (ref >60)
GLUCOSE: 98 mg/dL (ref 70–99)
Potassium: 4 mmol/L (ref 3.5–5.1)
Sodium: 136 mmol/L (ref 135–145)

## 2018-02-18 LAB — CBC
HEMATOCRIT: 38.5 % (ref 36.0–46.0)
Hemoglobin: 12 g/dL (ref 12.0–15.0)
MCH: 26.4 pg (ref 26.0–34.0)
MCHC: 31.2 g/dL (ref 30.0–36.0)
MCV: 84.8 fL (ref 80.0–100.0)
PLATELETS: 336 10*3/uL (ref 150–400)
RBC: 4.54 MIL/uL (ref 3.87–5.11)
RDW: 12.4 % (ref 11.5–15.5)
WBC: 10.8 10*3/uL — ABNORMAL HIGH (ref 4.0–10.5)
nRBC: 0 % (ref 0.0–0.2)

## 2018-02-18 LAB — PREGNANCY, URINE: Preg Test, Ur: NEGATIVE

## 2018-02-18 MED ORDER — CEPHALEXIN 500 MG PO CAPS
500.0000 mg | ORAL_CAPSULE | Freq: Once | ORAL | Status: AC
  Administered 2018-02-18: 500 mg via ORAL
  Filled 2018-02-18: qty 1

## 2018-02-18 MED ORDER — MECLIZINE HCL 25 MG PO TABS: 25.0000 mg | ORAL_TABLET | Freq: Three times a day (TID) | ORAL | 0 refills | Status: AC | PRN

## 2018-02-18 MED ORDER — SODIUM CHLORIDE 0.9 % IV BOLUS
1000.0000 mL | Freq: Once | INTRAVENOUS | Status: AC
  Administered 2018-02-18: 1000 mL via INTRAVENOUS

## 2018-02-18 MED ORDER — CEPHALEXIN 500 MG PO CAPS: 500.0000 mg | ORAL_CAPSULE | Freq: Three times a day (TID) | ORAL | 0 refills | Status: AC

## 2018-02-18 MED ORDER — MECLIZINE HCL 25 MG PO TABS
25.0000 mg | ORAL_TABLET | Freq: Once | ORAL | Status: AC
  Administered 2018-02-18: 25 mg via ORAL
  Filled 2018-02-18 (×2): qty 1

## 2018-02-18 MED ORDER — SODIUM CHLORIDE 0.9% FLUSH: 3.0000 mL | Freq: Once | INTRAVENOUS | Status: DC

## 2018-02-18 NOTE — ED Provider Notes (Addendum)
[facility]  [facility] Emergency Department Provider Note  ____________________________________________   I have reviewed the triage vital signs and the nursing notes. Where available I have reviewed prior notes and, if possible and indicated, outside hospital notes.    HISTORY  Chief Complaint Dizziness  Treated with interpreter  HPI Patricia Harmon is a 28 y.o. female who presents today complaining of feeling "dizzy".  Patient states that she has a history of chronic migraines, she has had them for years.  Usually though her migraines are different from this.  Is a different kind of pain mostly on the right side of her head and sometimes she has tingling in her left arm with her migraines.  She is not having any of that.  She states over the last 2 weeks, however, she has had a sensation of vertigo.  No true spinning motion.  Is worse when she stands up.  No new medications.  Denies pregnancy.  No nausea no vomiting.  She feels generally weak all over but no focal weakness.  She does not have any energy.  She does not have any significant shortness of breath, she describes this as a lack of energy.  She states that she has had no diarrhea no melena no bright red blood per rectum.  She denies any chest pain nausea or vomiting.  She denies any new onset headache, she does have a slight headache which she does not think is important.  Does not have any stiff neck or fever.  She has no recent URI symptoms.  She has no rash on her face.  She has no pain or ear trauma.  She has had no change in her hearing or vision.  She denies any new medications except for some sitting which she has been taking as a supplement to fight the actual symptoms.  She has not had this exact constellation of symptoms before but has had similar.  She does not have a personal or family history of PE or DVT and has had no recent travel no leg swellings and no exertional symptoms.  She denies any chest  pain.  She states that her biggest complaint is that she feels "lightheaded and "spinning" when she walks.  Is been going on uninterruptedly for 2 weeks.  Waxes and wanes but never completely goes away.  She is under a great deal of stress at home.  She has no SI or HI, but she does feel very stressed by being at home taking care of her children by herself.  He does not feel that she is being abused.    Past Medical History:  Diagnosis Date  . Headache     Patient Active Problem List   Diagnosis Date Noted  . Prenatal screening encounter 06/27/2016  . Pregnant 02/10/2015  . Postoperative state 02/10/2015    Past Surgical History:  Procedure Laterality Date  . CESAREAN SECTION     x 2  . CESAREAN SECTION N/A 02/10/2015   Procedure: CESAREAN SECTION with left ovarian cystectomy;  Surgeon: Suzy Bouchardhomas J Schermerhorn, MD;  Location: [facility] ORS;  Service: Obstetrics;  Laterality: N/A;    Prior to Admission medications   Medication Sig Start Date End Date Taking? Authorizing Provider  Prenatal Multivit-Min-Fe-FA (PRENATAL VITAMINS PO) Take 1 tablet by mouth daily.    [provider]    Allergies Patient has no known allergies.  Family History  Problem Relation Age of Onset  . Diabetes Mother     Social  History Social History   Tobacco Use  . Smoking status: Never Smoker  . Smokeless tobacco: Never Used  Substance Use Topics  . Alcohol use: No  . Drug use: No    Review of Systems Constitutional: No fever/chills Eyes: No visual changes. ENT: No sore throat. No stiff neck no neck pain Cardiovascular: Denies chest pain. Respiratory: Denies shortness of breath. Gastrointestinal:   no vomiting.  No diarrhea.  No constipation. Genitourinary: Negative for dysuria. Musculoskeletal: Negative lower extremity swelling Skin: Negative for rash. Neurological: Negative for severe headaches, focal weakness or numbness.   ____________________________________________   PHYSICAL  EXAM:  VITAL SIGNS: ED Triage Vitals  Enc Vitals Group     BP 02/18/18 1048 117/71     Pulse Rate 02/18/18 1048 84     Resp 02/18/18 1048 18     Temp 02/18/18 1048 98 F (36.7 C)     Temp Source 02/18/18 1048 Oral     SpO2 02/18/18 1048 100 %     Weight 02/18/18 1049 209 lb (94.8 kg)     Height 02/18/18 1049 5' 4.96" (1.65 m)     Head Circumference --      Peak Flow --      Pain Score 02/18/18 1049 8     Pain Loc --      Pain Edu? --      Excl. in GC? --     Constitutional: Alert and oriented. Well appearing and in no acute distress. Eyes: Conjunctivae are normal Head: Atraumatic HEENT: No congestion/rhinnorhea. Mucous membranes are moist.  Oropharynx non-erythematous TMs are normal bilaterally Neck:   Nontender with no meningismus, no masses, no stridor Cardiovascular: Normal rate, regular rhythm. Grossly normal heart sounds.  Good peripheral circulation. Respiratory: Normal respiratory effort.  No retractions. Lungs CTAB. Abdominal: Soft and nontender. No distention. No guarding no rebound Back:  There is no focal tenderness or step off.  there is no midline tenderness there are no lesions noted. there is no CVA tenderness Musculoskeletal: No lower extremity tenderness, no upper extremity tenderness. No joint effusions, no DVT signs strong distal pulses no edema Neurologic: Cranial nerves II through XII are grossly intact 5 out of 5 strength bilateral upper and lower extremity. Finger to nose within normal limits heel to shin within normal limits, speech is normal with no word finding difficulty or dysarthria, reflexes symmetric, pupils are equally round and reactive to light, there is no pronator drift, sensation is normal, vision is intact to confrontation, gait is deferred, there is no nystagmus, normal neurologic exam.  Skin:  Skin is warm, dry and intact. No rash noted. Psychiatric: Mood and affect are normal. Speech and behavior are  normal.  ____________________________________________   LABS (all labs ordered are listed, but only abnormal results are displayed)  Labs Reviewed  CBC - Abnormal; Notable for the following components:      Result Value   WBC 10.8 (*)    All other components within normal limits  BASIC METABOLIC PANEL  URINALYSIS, COMPLETE (UACMP) WITH MICROSCOPIC  CBG MONITORING, ED  POC URINE PREG, ED    Pertinent labs  results that were available during my care of the patient were reviewed by me and considered in my medical decision making (see chart for details). ____________________________________________  EKG  I personally interpreted any EKGs ordered by me or triage Sinus rhythm rate 73 beats minute no acute ST elevation depression normal axis unremarkable EKG ____________________________________________  RADIOLOGY  Pertinent labs & imaging results  that were available during my care of the patient were reviewed by me and considered in my medical decision making (see chart for details). If possible, patient and/or family made aware of any abnormal findings.  No results found. ____________________________________________    PROCEDURES  Procedure(s) performed: None  Procedures  Critical Care performed: None  ____________________________________________   INITIAL IMPRESSION / ASSESSMENT AND PLAN / ED COURSE  Pertinent labs & imaging results that were available during my care of the patient were reviewed by me and considered in my medical decision making (see chart for details).  Patient in no acute distress, neuro logic exam is reassuring.  She has some very vaguely described symptoms of lack of energy and what she describes as true vertigo.  TMs are normal.  Low suspicion for CVA in any event her symptoms started 2 weeks ago which would make her outside the window for any acute intervention.  Given her history of immigrant status from [place], the fact that she is never seem to  have any imaging for her recurrent chronic headache symptoms associate with neurologic symptoms we will obtain a screening CT scan although I have low suspicion for bleed or cysticercosis.  Should serve to give us some grounding to hopefully get her safely home.  We will treat her with Antivert.  She is in no acute distress.  Very difficult to determine what else is going on with her at this time.  Nothing at this time I do not think to suggest PE.  Patient has no real risk factors for, she is PERC negative, she has no symptoms of a PE, and she is complaining of true vertigo.  It is to be held that she feels better after fluid and Antivert.  ----------------------------------------- 6:10 PM on 02/18/2018 -----------------------------------------  Antivert and fluids she feels 100% better, she no longer feels "dizzy".  She is eager to go home.  Work-up otherwise unremarkable.  She does have a urinary tract infection, urine culture has been sent, this certainly could be contributing to her general sense of fatigue.  No evidence of pyelonephritis.  Abdomen benign.  We will discharge with close outpatient follow-up return precautions given and understood.    ____________________________________________   FINAL CLINICAL IMPRESSION(S) / ED DIAGNOSES  Final diagnoses:  None      This chart was dictated using voice recognition software.  Despite best efforts to proofread,  errors can occur which can change meaning.      Jeanmarie PlantMcShane, Toniesha Zellner A, MD 02/18/18 1551    Jeanmarie PlantMcShane, Modest Draeger A, MD 02/18/18 [id number]

## 2018-02-18 NOTE — ED Triage Notes (Signed)
Pt presents to ED via wheelchair from [facility]. Pt c/o dizziness x 2 weeks. Pt denies N/V/D. Pt also c/o SOB when doing "household activities". Pt is alert and oriented at this time, neurologically intact at this time, facial symmetry intact.

## 2018-02-18 NOTE — ED Notes (Signed)
Via interpreter VRI  Pt has been dizzy, weak, had headache, and "can't breathe too well" x 2 weeks. She describes the dizziness as the room spinning, and that she "has no strength to do things." When expanding on that, she states that she gets tired and sob from doing housework and taking care of the children. The states that her headache is in the temporal area and crown of the head. She reports taking advil with some relief of the headache, which is currently at pain level 7/10. Pt alert & oriented; no weakness noted on either side, no facial droop. NAD noted.

## 2018-02-19 LAB — URINE CULTURE

## 2018-05-02 ENCOUNTER — Emergency Department
Admission: EM | Admit: 2018-05-02 | Discharge: 2018-05-02 | Disposition: A | Discharge status: Home / Self Care | Payer: Self-pay | Attending: Emergency Medicine | Admitting: Emergency Medicine

## 2018-05-02 ENCOUNTER — Encounter: Payer: Self-pay | Admitting: Emergency Medicine

## 2018-05-02 ENCOUNTER — Other Ambulatory Visit: Payer: Self-pay

## 2018-05-02 DIAGNOSIS — R51 Headache: Secondary | ICD-10-CM | POA: Insufficient documentation

## 2018-05-02 DIAGNOSIS — Z79899 Other long term (current) drug therapy: Secondary | ICD-10-CM | POA: Insufficient documentation

## 2018-05-02 DIAGNOSIS — R0602 Shortness of breath: Secondary | ICD-10-CM | POA: Insufficient documentation

## 2018-05-02 DIAGNOSIS — R030 Elevated blood-pressure reading, without diagnosis of hypertension: Secondary | ICD-10-CM | POA: Insufficient documentation

## 2018-05-02 DIAGNOSIS — R519 Headache, unspecified: Secondary | ICD-10-CM

## 2018-05-02 MED ORDER — BUTALBITAL-APAP-CAFFEINE 50-325-40 MG PO TABS: 1.0000 | ORAL_TABLET | Freq: Four times a day (QID) | ORAL | 0 refills | Status: AC | PRN

## 2018-05-02 NOTE — ED Provider Notes (Signed)
[facility] Emergency Department Provider Note  ____________________________________________   None    (approximate)  I have reviewed the triage vital signs and the nursing notes.   HISTORY  Chief Complaint Headache   HPI Patricia Harmon is a 28 y.o. female who presents to the emergency department for treatment and evaluation of headache. EMS was called because her husband had never seen her in "so much pain" with her headaches. She states that she was really upset about and got short of breath. She reports chronic history of migraine for which ibuprofen helps. She did take ibuprofen today and is now headache free. She states that EMS told her that her BP was high which is why she came to the ER. She is now headache free.     Past Medical History:  Diagnosis Date  . Headache     Patient Active Problem List   Diagnosis Date Noted  . Prenatal screening encounter 06/27/2016  . Pregnant 02/10/2015  . Postoperative state 02/10/2015    Past Surgical History:  Procedure Laterality Date  . CESAREAN SECTION     x 2  . CESAREAN SECTION N/A 02/10/2015   Procedure: CESAREAN SECTION with left ovarian cystectomy;  Surgeon: Suzy Bouchardhomas J Schermerhorn, MD;  Location: [facility] ORS;  Service: Obstetrics;  Laterality: N/A;    Prior to Admission medications   Medication Sig Start Date End Date Taking? Authorizing Provider  butalbital-acetaminophen-caffeine (FIORICET) 50-325-40 MG tablet Take 1 tablet by mouth every 6 (six) hours as needed for headache. 05/02/18 05/02/19  Khai Torbert, Rulon Eisenmengerari B, FNP  meclizine (ANTIVERT) 25 MG tablet Take 1 tablet (25 mg total) by mouth 3 (three) times daily as needed. 02/18/18   Jeanmarie PlantMcShane, James A, MD  Prenatal Multivit-Min-Fe-FA (PRENATAL VITAMINS PO) Take 1 tablet by mouth daily.    [provider]    Allergies Patient has no known allergies.  Family History  Problem Relation Age of Onset  . Diabetes Mother     Social  History Social History   Tobacco Use  . Smoking status: Never Smoker  . Smokeless tobacco: Never Used  Substance Use Topics  . Alcohol use: No  . Drug use: No    Review of Systems  Constitutional: No fever/chills Eyes: No visual changes. ENT: No sore throat. Cardiovascular: Denies chest pain. Respiratory: Denies shortness of breath. Gastrointestinal: No abdominal pain.  No nausea, no vomiting.  No diarrhea.  No constipation. Genitourinary: Negative for dysuria. Musculoskeletal: Negative for back pain. Skin: Negative for rash. Neurological: Positive for headaches, focal weakness or numbness. ____________________________________________   PHYSICAL EXAM:  VITAL SIGNS: ED Triage Vitals  Enc Vitals Group     BP      Pulse      Resp      Temp      Temp src      SpO2      Weight      Height      Head Circumference      Peak Flow      Pain Score      Pain Loc      Pain Edu?      Excl. in GC?     Constitutional: Alert and oriented. Well appearing and in no acute distress. Eyes: Conjunctivae are normal. PERRL. EOMI. Head: Atraumatic. Nose: No congestion/rhinnorhea. Mouth/Throat: Mucous membranes are moist.  Oropharynx non-erythematous. Neck: No stridor.   Cardiovascular: Normal rate, regular rhythm. Grossly normal heart sounds.  Good peripheral circulation. Respiratory: Normal respiratory effort.  No retractions. Lungs CTAB. Gastrointestinal: Soft and nontender. No distention. No abdominal bruits. No CVA tenderness. Musculoskeletal: No lower extremity tenderness nor edema.  No joint effusions. Neurologic:  Normal speech and language. No gross focal neurologic deficits are appreciated. No gait instability. Skin:  Skin is warm, dry and intact. No rash noted. Psychiatric: Mood and affect are normal. Speech and behavior are normal.  ____________________________________________   LABS (all labs ordered are listed, but only abnormal results are displayed)  Labs  Reviewed - No data to display ____________________________________________  EKG  Not indicated. ____________________________________________  RADIOLOGY  ED MD interpretation:  Not indicated.  Official radiology report(s): No results found.  ____________________________________________   PROCEDURES  Procedure(s) performed: None  Procedures  Critical Care performed: No  ____________________________________________   INITIAL IMPRESSION / ASSESSMENT AND PLAN / ED COURSE  28 year old female presenting to the emergency department for treatment and evaluation after having had a migraine headache.  She took her ibuprofen which did resolve the headache.  He states that her husband got scared because he thought she was not breathing very well and called EMS.  Patient states that on scene she wanted to tell them she did not need to come to the emergency department, however when they checked her blood pressure it was elevated so did she decided to come.  She states that she is now back to normal.  She denies headache.  She does not feel short of breath.  She has had no cough, fever, nausea, vomiting, diarrhea, or other concerns.  She has had no known sick exposures.  She has not been exposed anyone who has tested positive for COVID-19.  She will be discharged home with a prescription for Fioricet to be used if headache returns and ibuprofen does not help.  Patricia Harmon was evaluated in Emergency Department on 05/02/2018 for the symptoms described in the history of present illness. She was evaluated in the context of the global COVID-19 pandemic, which necessitated consideration that the patient might be at risk for infection with the SARS-CoV-2 virus that causes COVID-19. Institutional protocols and algorithms that pertain to the evaluation of patients at risk for COVID-19 are in a state of rapid change based on information released by regulatory bodies including the CDC and federal and  state organizations. These policies and algorithms were followed during the patient's care in the ED.       ____________________________________________   FINAL CLINICAL IMPRESSION(S) / ED DIAGNOSES  Final diagnoses:  Acute intractable headache, unspecified headache type     ED Discharge Orders         Ordered    butalbital-acetaminophen-caffeine (FIORICET) 50-325-40 MG tablet  Every 6 hours PRN     05/02/18 1839           Note:  This document was prepared using Dragon voice recognition software and may include unintentional dictation errors.    Chinita Pester, FNP 05/02/18 1909    Emily Filbert, MD 05/02/18 [place]

## 2018-05-02 NOTE — ED Provider Notes (Addendum)
Patient was seen by me in supervision.  Patient presented in no acute distress mainly for headache which has resolved at this time.  She looks well and has no symptoms of coronavirus.  She is cleared for outpatient follow-up.   Emily Filbert, MD 05/02/18 Merrily Brittle    Emily Filbert, MD 05/02/18 [phone]

## 2018-05-02 NOTE — ED Triage Notes (Signed)
Pt to ER via EMS from home with c/o cough starting 2 days ago, headache, fever and bodyaches starting today.  Pt also reports SHOB.

## 2018-05-02 NOTE — ED Notes (Signed)
Interpreter on a stick initiated with provider in room.  Pt now states through interpreter that her only symptoms have been headache today.  Pt states some mild SHOB during worst of headache.  Pt states symptoms mostly resolved at this time.  Pt denies cough, denies SHOB prior to today.  Pt denies other s/s at this time.  Pt denies sick contacts.

## 2020-04-04 IMAGING — CT CT HEAD W/O CM
3 series · 16 of 46 positions shown, 19 images · non-contrast
Comparison: None.

CLINICAL DATA: Dizziness

EXAM:
CT HEAD WITHOUT CONTRAST
TECHNIQUE: Contiguous axial images were obtained from the base of the skull
through the vertex without intravenous contrast.

[Series 2: head wo · axial · 0.47mm/px · z∈[-141,-21]mm · 10 of 29 slices shown, 13 images]
[im 3/29  brain]
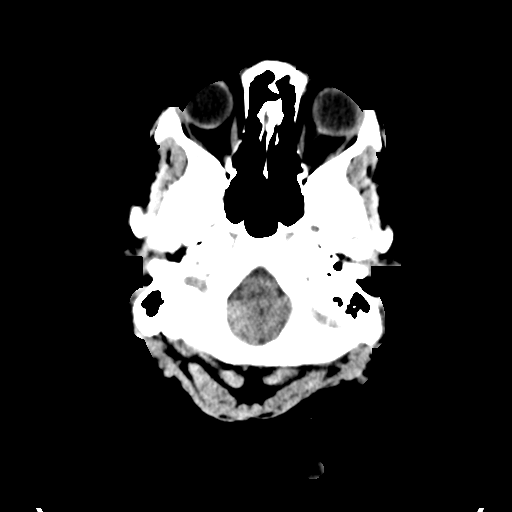
[im 3/29  bone]
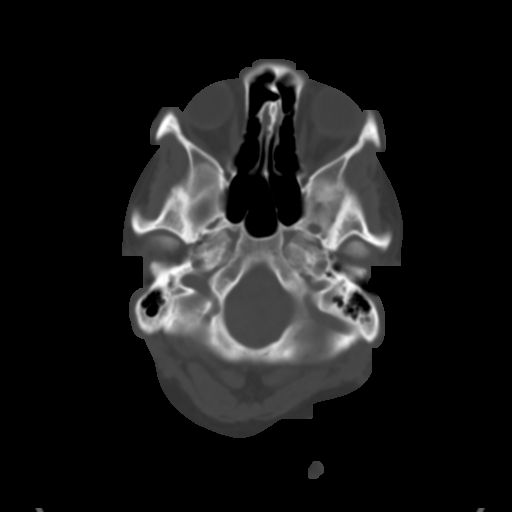
[im 6/29  brain]
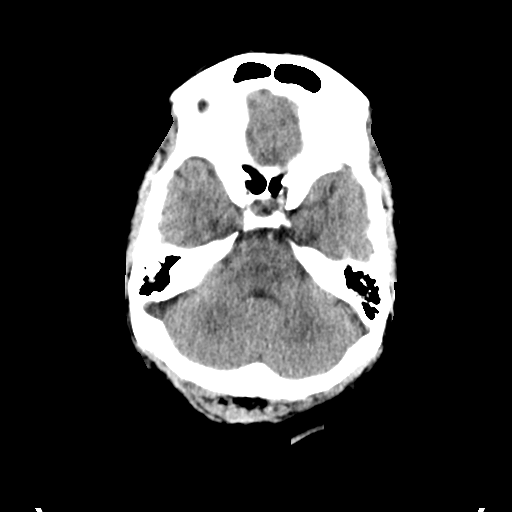
[im 8/29  brain]
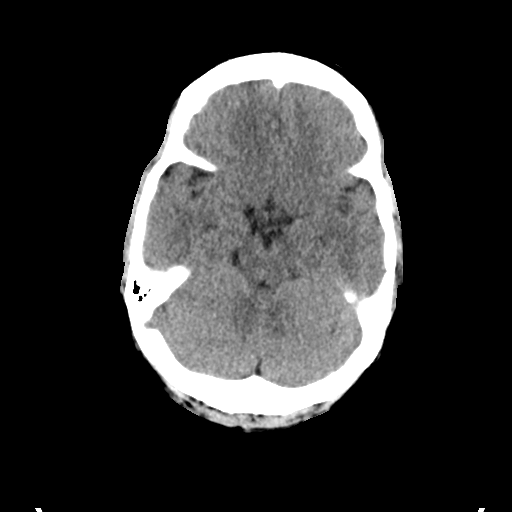
[im 11/29  brain]
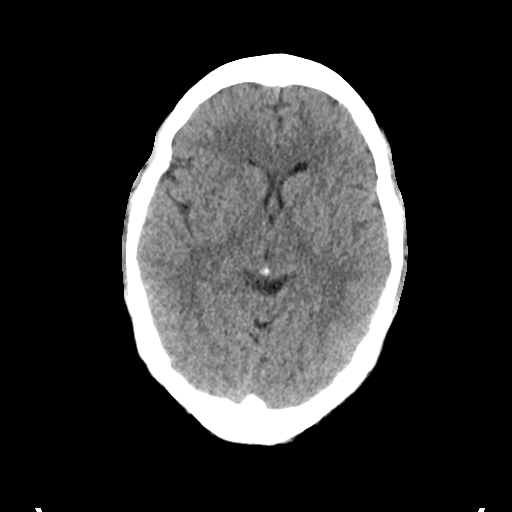
[im 14/29  brain]
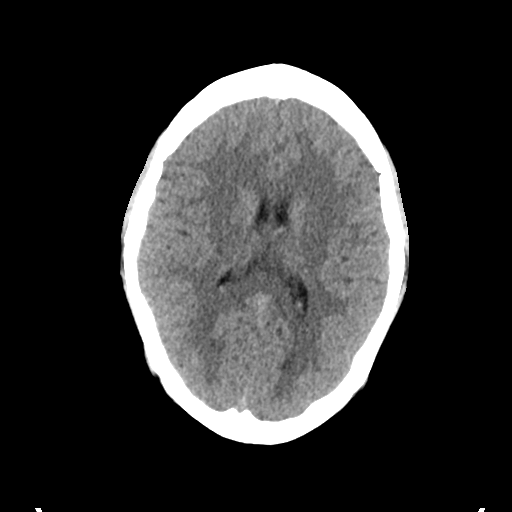
[im 14/29  bone]
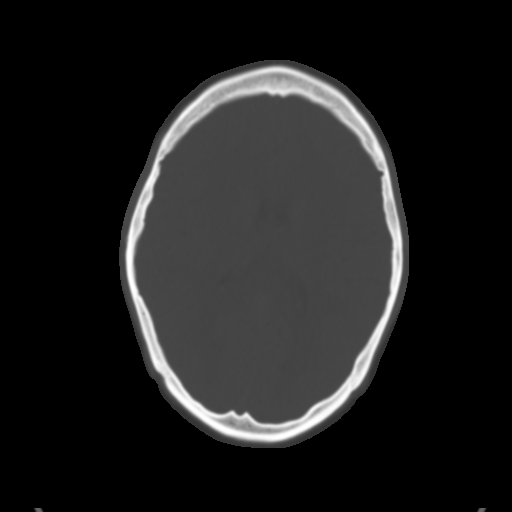
[im 16/29  brain]
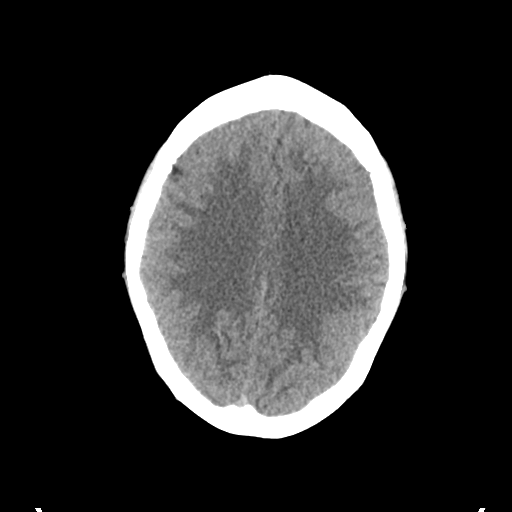
[im 19/29  brain]
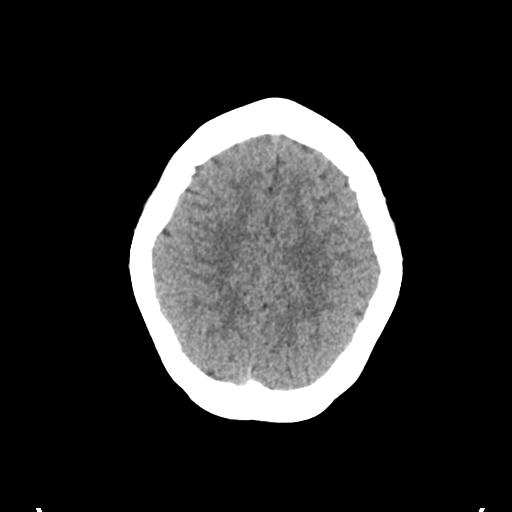
[im 22/29  brain]
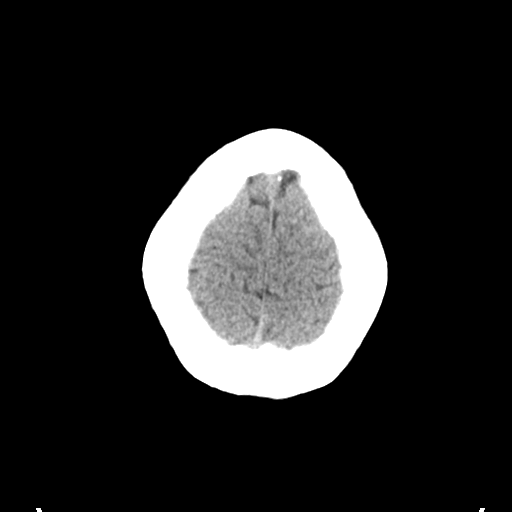
[im 24/29  brain]
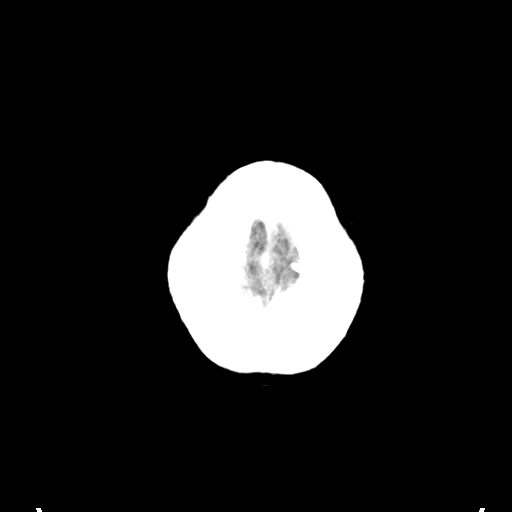
[im 24/29  bone]
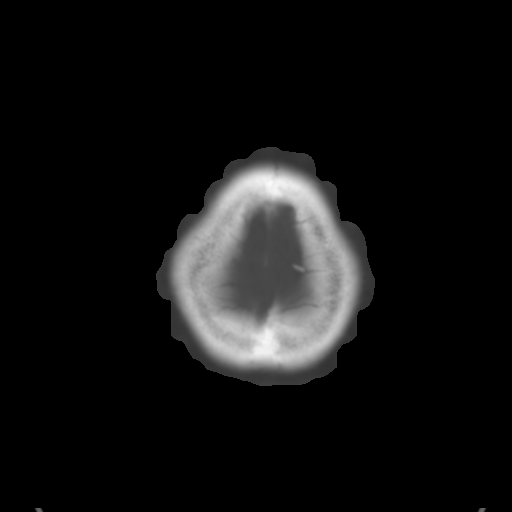
[im 27/29  brain]
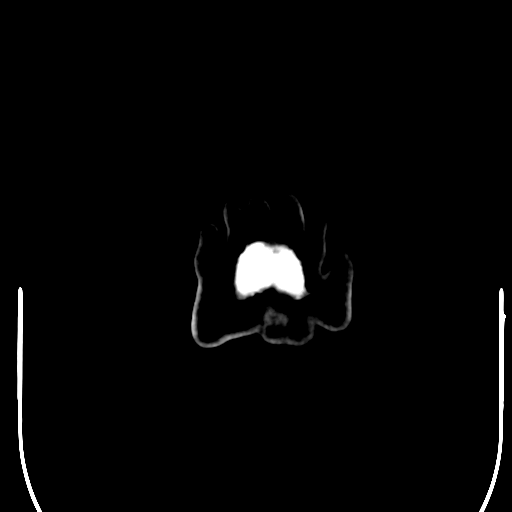

[Series 4: coronal soft tissue · coronal · 0.28mm/px · 3 of 63 slices shown]
[im 21/63  brain]
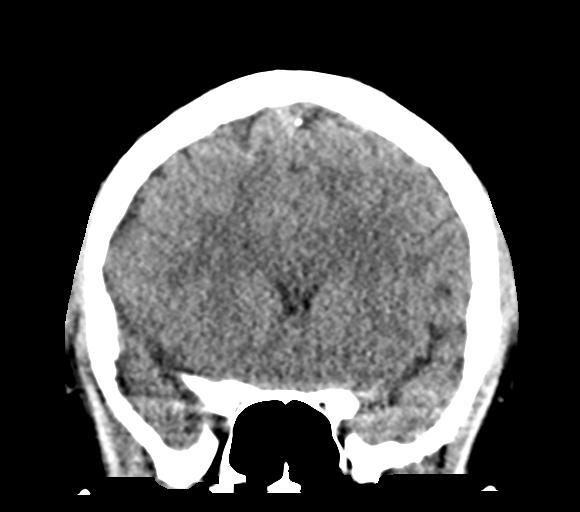
[im 28/63  brain]
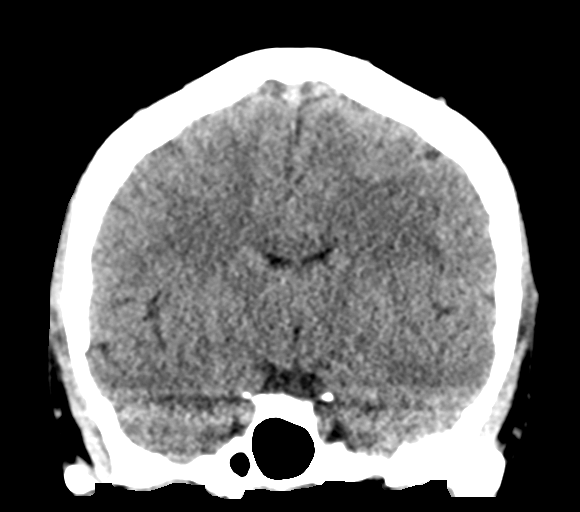
[im 35/63  brain]
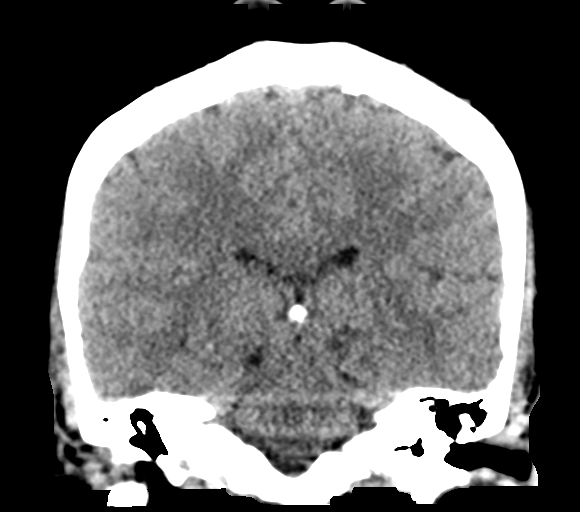

[Series 5: sagittal soft tissue · sagittal · 0.31mm/px · 3 of 48 slices shown]
[im 16/48  brain]
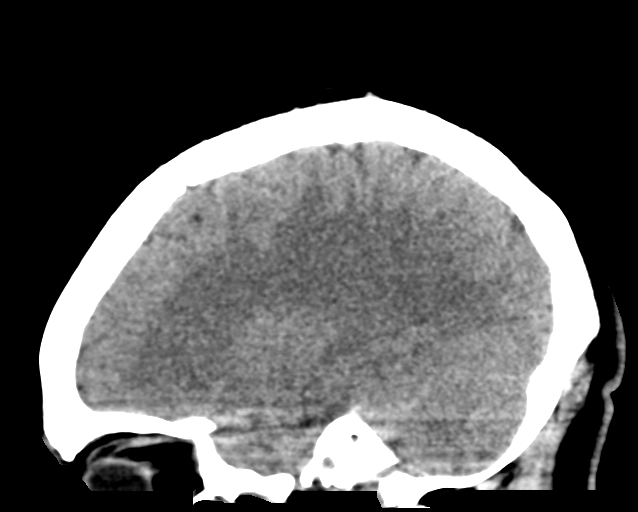
[im 24/48  brain]
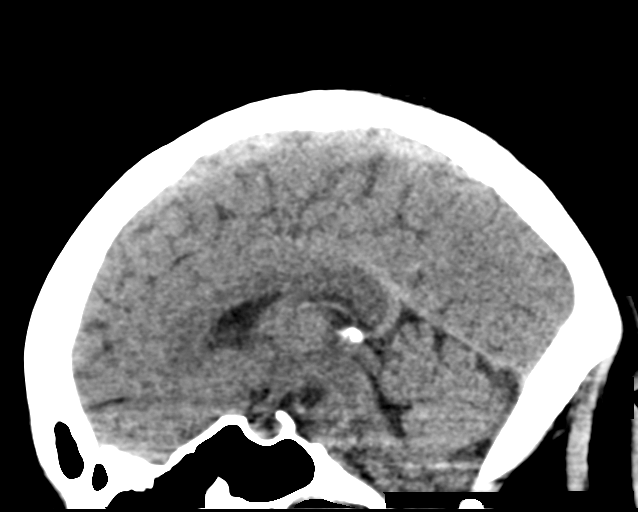
[im 32/48  brain]
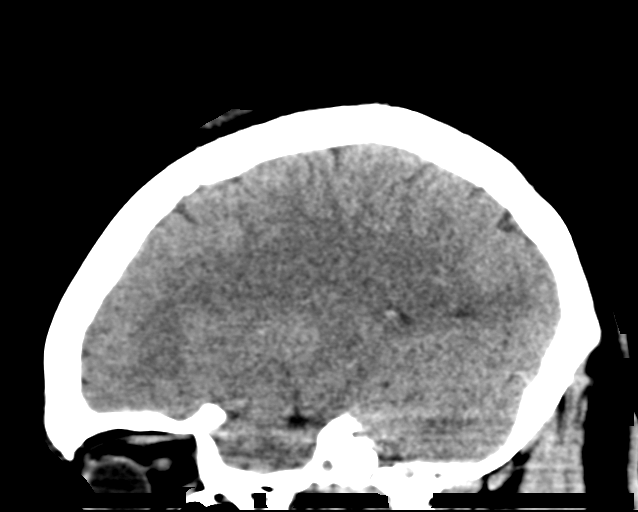

[16 of 46 positions shown; findings below may reference images not displayed]

FINDINGS: BRAIN: Gray-white matter distinction is maintained. No intra-axial
mass, large vascular territory infarct, hemorrhage, midline shift or
edema. No extra-axial fluid collections. No effacement of the basal
cisterns. Midline fourth ventricle. The brainstem and cerebellum are
nonacute.

VASCULAR: Unremarkable.

SKULL/SOFT TISSUES: No skull fracture. No significant soft tissue
swelling.

ORBITS/SINUSES: The included ocular globes and orbital contents are
normal.The mastoid air cells are clear. The included paranasal
sinuses are well-aerated.

OTHER: None.
IMPRESSION: Normal head CT.

## 2023-09-22 ENCOUNTER — Other Ambulatory Visit: Payer: Self-pay

## 2023-09-22 ENCOUNTER — Emergency Department
Admission: EM | Admit: 2023-09-22 | Discharge: 2023-09-22 | Disposition: A | Discharge status: Home / Self Care | Payer: Self-pay | Attending: Emergency Medicine | Admitting: Emergency Medicine

## 2023-09-22 ENCOUNTER — Emergency Department: Payer: Self-pay

## 2023-09-22 DIAGNOSIS — R059 Cough, unspecified: Secondary | ICD-10-CM | POA: Insufficient documentation

## 2023-09-22 DIAGNOSIS — R079 Chest pain, unspecified: Secondary | ICD-10-CM | POA: Insufficient documentation

## 2023-09-22 DIAGNOSIS — R062 Wheezing: Secondary | ICD-10-CM | POA: Insufficient documentation

## 2023-09-22 DIAGNOSIS — R0602 Shortness of breath: Secondary | ICD-10-CM | POA: Insufficient documentation

## 2023-09-22 DIAGNOSIS — R42 Dizziness and giddiness: Secondary | ICD-10-CM | POA: Insufficient documentation

## 2023-09-22 DIAGNOSIS — D72829 Elevated white blood cell count, unspecified: Secondary | ICD-10-CM | POA: Insufficient documentation

## 2023-09-22 DIAGNOSIS — D649 Anemia, unspecified: Secondary | ICD-10-CM | POA: Insufficient documentation

## 2023-09-22 DIAGNOSIS — R000 Tachycardia, unspecified: Secondary | ICD-10-CM | POA: Insufficient documentation

## 2023-09-22 LAB — CBC
HCT: 33.6 % — ABNORMAL LOW (ref 36.0–46.0)
Hemoglobin: 10.1 g/dL — ABNORMAL LOW (ref 12.0–15.0)
MCH: 21.4 pg — ABNORMAL LOW (ref 26.0–34.0)
MCHC: 30.1 g/dL (ref 30.0–36.0)
MCV: 71.3 fL — ABNORMAL LOW (ref 80.0–100.0)
Platelets: 444 K/uL — ABNORMAL HIGH (ref 150–400)
RBC: 4.71 MIL/uL (ref 3.87–5.11)
RDW: 16.3 % — ABNORMAL HIGH (ref 11.5–15.5)
WBC: 16.8 K/uL — ABNORMAL HIGH (ref 4.0–10.5)
nRBC: 0 % (ref 0.0–0.2)

## 2023-09-22 LAB — RESP PANEL BY RT-PCR (RSV, FLU A&B, COVID)  RVPGX2
Influenza A by PCR: NEGATIVE
Influenza B by PCR: NEGATIVE
Resp Syncytial Virus by PCR: NEGATIVE
SARS Coronavirus 2 by RT PCR: NEGATIVE

## 2023-09-22 LAB — BASIC METABOLIC PANEL WITH GFR
Anion gap: 13 (ref 5–15)
BUN: 10 mg/dL (ref 6–20)
CO2: 20 mmol/L — ABNORMAL LOW (ref 22–32)
Calcium: 9.1 mg/dL (ref 8.9–10.3)
Chloride: 105 mmol/L (ref 98–111)
Creatinine, Ser: 0.92 mg/dL (ref 0.44–1.00)
GFR, Estimated: >60 mL/min (ref >60)
Glucose, Bld: 111 mg/dL — ABNORMAL HIGH (ref 70–99)
Potassium: 3.6 mmol/L (ref 3.5–5.1)
Sodium: 138 mmol/L (ref 135–145)

## 2023-09-22 MED ORDER — IPRATROPIUM-ALBUTEROL 0.5-2.5 (3) MG/3ML IN SOLN
6.0000 mL | Freq: Once | RESPIRATORY_TRACT | Status: AC
  Administered 2023-09-22: 6 mL via RESPIRATORY_TRACT
  Filled 2023-09-22: qty 6

## 2023-09-22 MED ORDER — METHYLPREDNISOLONE SODIUM SUCC 125 MG IJ SOLR
125.0000 mg | Freq: Once | INTRAMUSCULAR | Status: AC
  Administered 2023-09-22: 125 mg via INTRAVENOUS
  Filled 2023-09-22: qty 2

## 2023-09-22 MED ORDER — RACEPINEPHRINE HCL 2.25 % IN NEBU
0.5000 mL | INHALATION_SOLUTION | Freq: Once | RESPIRATORY_TRACT | Status: AC
  Administered 2023-09-22: 0.5 mL via RESPIRATORY_TRACT
  Filled 2023-09-22: qty 15

## 2023-09-22 NOTE — ED Triage Notes (Signed)
 Pt comes with sob. Pt states this started today. Pt states dizziness earlier and then this. Pt states chest pain. Pt has obvious labored breathing. Pt states dry cough.

## 2023-09-22 NOTE — Discharge Instructions (Signed)
 You were seen in the ER today for evaluation of your episode of wheezing.  I am glad you are feeling better after receiving treatment here.  I suspect you had some sort of environmental trigger that caused your episode. Please follow-up with your primary care doctor for further evaluation.  Return to the ER for new, worsening, or recurrent symptoms.

## 2023-09-22 NOTE — ED Provider Notes (Signed)
 [facility] Provider Note    Event Date/Time   First MD Initiated Contact with Patient 09/22/23 1746     (approximate)   History   Shortness of Breath   HPI  Patricia Harmon is a 33 year old female presenting to the emergency department for evaluation of shortness of breath.  Patient was at work when she had onset of dizziness.  She not think much of it and went home.  However when she got home she had onset of worsening shortness of breath with wheezing.  No history of asthma or COPD.  Denies dust or other environmental exposures at work.  Does report associated chest pain with her breathing.  Has had intermittent episodes of coughing.  No fevers.  No history of similar.     Physical Exam   Triage Vital Signs: ED Triage Vitals  Encounter Vitals Group     BP 09/22/23 1700 (!) 131/119     Girls Systolic BP Percentile --      Girls Diastolic BP Percentile --      Boys Systolic BP Percentile --      Boys Diastolic BP Percentile --      Pulse Rate 09/22/23 1700 (!) 106     Resp 09/22/23 1700 (!) 25     Temp 09/22/23 1700 98.3 F (36.8 C)     Temp src --      SpO2 09/22/23 1700 100 %     Weight 09/22/23 1702 200 lb (90.7 kg)     Height 09/22/23 1702 5' 5 (1.651 m)     Head Circumference --      Peak Flow --      Pain Score --      Pain Loc --      Pain Education --      Exclude from Growth Chart --     Most recent vital signs: Vitals:   09/22/23 1815 09/22/23 1942  BP: 139/82 107/64  Pulse: 96 78  Resp: 18 18  Temp:  98 F (36.7 C)  SpO2: 100% 99%     General: Awake, interactive  CV:  Tachycardic with regular rhythm, good peripheral perfusion Resp:  Back with labored respirations and diffuse expiratory wheezing Abd:  Nondistended.  Neuro:  Symmetric facial movement, fluid speech   ED Results / Procedures / Treatments   Labs (all labs ordered are listed, but only abnormal results are displayed) Labs Reviewed  BASIC  METABOLIC PANEL WITH GFR - Abnormal; Notable for the following components:      Result Value   CO2 20 (*)    Glucose, Bld 111 (*)    All other components within normal limits  CBC - Abnormal; Notable for the following components:   WBC 16.8 (*)    Hemoglobin 10.1 (*)    HCT 33.6 (*)    MCV 71.3 (*)    MCH 21.4 (*)    RDW 16.3 (*)    Platelets 444 (*)    All other components within normal limits  RESP PANEL BY RT-PCR (RSV, FLU A&B, COVID)  [id number]     EKG EKG independently reviewed and interpreted by myself demonstrates:  EKG demonstrate sinus tachycardia at a rate of 106, PR 154, QRS 82, QTc 472, no acute ST changes  RADIOLOGY Imaging independently reviewed and interpreted by myself demonstrates:  CXR without focal consolidation  Formal Radiology Read:  DG Chest 2 View Result Date: 09/22/2023 CLINICAL DATA:  soband dizziness EXAM: CHEST - 2 VIEW  COMPARISON:  None available. FINDINGS: No focal airspace consolidation, pleural effusion, or pneumothorax. No cardiomegaly.No acute fracture or destructive lesion. IMPRESSION: No acute cardiopulmonary abnormality. Electronically Signed   By: Rogelia Myers M.D.   On: 09/22/2023 17:34    PROCEDURES:  Critical Care performed: No  Procedures   MEDICATIONS ORDERED IN ED: Medications  ipratropium-albuterol  (DUONEB) 0.5-2.5 (3) MG/3ML nebulizer solution 6 mL (6 mLs Nebulization Given 09/22/23 1809)  methylPREDNISolone  sodium succinate (SOLU-MEDROL ) 125 mg/2 mL injection 125 mg (125 mg Intravenous Given 09/22/23 1810)  Racepinephrine HCl 2.25 % nebulizer solution 0.5 mL (0.5 mLs Nebulization Given 09/22/23 1818)     IMPRESSION / MDM / ASSESSMENT AND PLAN / ED COURSE  I reviewed the triage vital signs and the nursing notes.  Differential diagnosis includes, but is not limited to, bronchospasm, undiagnosed asthma, viral illness, pneumonia, clinical presentation not consistent with anaphylaxis  Patient's presentation is most consistent  with acute presentation with potential threat to life or bodily function.  33 year old female presenting with dizziness and shortness of breath.  Tachycardic and tachypneic on presentation with audible wheezing.  Labs with leukocytosis WC 16.8, mild anemia with hemoglobin of 10.1, no reported acute bleeding sources.  BMP reassuring.  Viral swab negative.  X-Irem [place] without evidence of pneumonia.  Suspect likely bronchospasm with unclear trigger.  Will treat with nebulizer treatments and steroids and reassess.  Clinical Course as of 09/22/23 2033  Mon Sep 22, 2023  1815 Patient reassessed after receiving nebulizer treatment.  Wheezing in her lung fields is improved, but does continue to have audible expiratory sounds.  Will trial racemic epinephrine. [NR]  1839 Patient reassessed after receiving racemic epinephrine.  Feels much improved without any further audible wheezing or wheezing noted on auscultation.  Unclear trigger but clinical history suggestive of bronchospasm, lower suspicion laryngospasm in the absence of stridor or abnormal inspiratory sounds. [NR]  2027 Patient serially evaluated and he continues to report improved symptoms.  Has not been more than 2 hours since receiving racemic epinephrine without recurrent wheezing.  Do think she is stable for discharge at this time.  Recommended outpatient follow-up for further evaluation of possible triggers for her episode.  Strict return precautions provided.  Patient discharged in stable condition. [NR]    Clinical Course User Index [NR] Levander Slate, MD     FINAL CLINICAL IMPRESSION(S) / ED DIAGNOSES   Final diagnoses:  Wheezing     Rx / DC Orders   ED Discharge Orders     None        Note:  This document was prepared using Dragon voice recognition software and may include unintentional dictation errors.   Levander Slate, MD 09/22/23 2033
